# Patient Record
Sex: Male | Born: 1990 | Race: Black or African American | Hispanic: No | Marital: Single | Smoking: Current some day smoker
Health system: Southern US, Community
[De-identification: ages and names within clinical notes are randomized; demographics above are authoritative.]

## PROBLEM LIST (undated history)

## (undated) DIAGNOSIS — L309 Dermatitis, unspecified: Secondary | ICD-10-CM

## (undated) HISTORY — DX: Dermatitis, unspecified: L30.9

---
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
## 2015-10-17 ENCOUNTER — Encounter: Payer: Self-pay | Admitting: Emergency Medicine

## 2015-10-17 DIAGNOSIS — R079 Chest pain, unspecified: Secondary | ICD-10-CM | POA: Insufficient documentation

## 2015-10-17 DIAGNOSIS — R42 Dizziness and giddiness: Secondary | ICD-10-CM | POA: Diagnosis not present

## 2015-10-17 DIAGNOSIS — R109 Unspecified abdominal pain: Secondary | ICD-10-CM | POA: Diagnosis present

## 2015-10-17 DIAGNOSIS — R0602 Shortness of breath: Secondary | ICD-10-CM | POA: Insufficient documentation

## 2015-10-17 DIAGNOSIS — F17200 Nicotine dependence, unspecified, uncomplicated: Secondary | ICD-10-CM | POA: Diagnosis not present

## 2015-10-17 LAB — COMPREHENSIVE METABOLIC PANEL
ALT: 72 U/L — AB (ref 17–63)
ANION GAP: 6 (ref 5–15)
AST: 45 U/L — ABNORMAL HIGH (ref 15–41)
Albumin: 4.1 g/dL (ref 3.5–5.0)
Alkaline Phosphatase: 68 U/L (ref 38–126)
BUN: 13 mg/dL (ref 6–20)
CHLORIDE: 110 mmol/L (ref 101–111)
CO2: 24 mmol/L (ref 22–32)
CREATININE: 0.96 mg/dL (ref 0.61–1.24)
Calcium: 9.1 mg/dL (ref 8.9–10.3)
GFR calc non Af Amer: >60 mL/min (ref >60)
Glucose, Bld: 112 mg/dL — ABNORMAL HIGH (ref 65–99)
POTASSIUM: 3.6 mmol/L (ref 3.5–5.1)
SODIUM: 140 mmol/L (ref 135–145)
Total Bilirubin: 0.3 mg/dL (ref 0.3–1.2)
Total Protein: 7.1 g/dL (ref 6.5–8.1)

## 2015-10-17 LAB — CBC
HEMATOCRIT: 42.7 % (ref 40.0–52.0)
HEMOGLOBIN: 14.2 g/dL (ref 13.0–18.0)
MCH: 27.5 pg (ref 26.0–34.0)
MCHC: 33.2 g/dL (ref 32.0–36.0)
MCV: 83 fL (ref 80.0–100.0)
Platelets: 237 10*3/uL (ref 150–440)
RBC: 5.15 MIL/uL (ref 4.40–5.90)
RDW: 14.1 % (ref 11.5–14.5)
WBC: 5.7 10*3/uL (ref 3.8–10.6)

## 2015-10-17 LAB — TROPONIN I: Troponin I: <0.03 ng/mL (ref <0.031)

## 2015-10-17 LAB — LIPASE, BLOOD: LIPASE: 32 U/L (ref 11–51)

## 2015-10-17 NOTE — ED Notes (Signed)
Pt states onset of emesis x1 less than one hour pta with associated epigastric pain. Pt denies nausea currently, denies fever, chills, diarrhea. Pt appears in no acute distress. Last po intake taco salad at 2030.

## 2015-10-18 ENCOUNTER — Emergency Department: Payer: BLUE CROSS/BLUE SHIELD

## 2015-10-18 ENCOUNTER — Emergency Department
Admission: EM | Admit: 2015-10-18 | Discharge: 2015-10-18 | Disposition: A | Discharge status: Home / Self Care | Payer: BLUE CROSS/BLUE SHIELD | Attending: Emergency Medicine | Admitting: Emergency Medicine

## 2015-10-18 DIAGNOSIS — R079 Chest pain, unspecified: Secondary | ICD-10-CM

## 2015-10-18 LAB — TROPONIN I: Troponin I: <0.03 ng/mL (ref <0.031)

## 2015-10-18 NOTE — ED Notes (Signed)
Repeat troponin sent

## 2015-10-18 NOTE — ED Provider Notes (Signed)
[facility] Emergency Department Provider Note  ____________________________________________  Time seen: Approximately 405 AM  I have reviewed the triage vital signs and the nursing notes.   HISTORY  Chief Complaint Emesis and Abdominal Pain    HPI Nathan Compton is a 25 y.o. male who comes into the hospital today with some chest pain. The patient reports that around 9:30 PM he had an episode of vomiting. He reports that while he was vomiting he developed some chest pain and shortness of breath. He reports that the symptoms are gone now but it lasted about an hour and a half. The patient denies any diarrhea and had some slight abdominal pain. The patient endorses some dizziness and nausea but did not vomit again. The patient denies sick contacts and denies any strange nowhere foods. The patient reports that he had a similar symptom last year but had an EKG and everything was fine. The pain was a sharp stabbing pain into his heart is what the patient reports. The patient reports that when he had the pain a year ago he had taken Mucinex before hand but he did not take anything like that tonight. The patient did not take any medicines for his pain. He called his mother and his mother recommended him coming into the hospital immediately.   History reviewed. No pertinent past medical history.  There are no active problems to display for this patient.   History reviewed. No pertinent past surgical history.  No current outpatient prescriptions on file.  Allergies Review of patient's allergies indicates no known allergies.  History reviewed. No pertinent family history.  Social History Social History  Substance Use Topics  . Smoking status: Current Every Day Smoker  . Smokeless tobacco: Never Used  . Alcohol Use: Yes    Review of Systems Constitutional: No fever/chills Eyes: No visual changes. ENT: No sore throat. Cardiovascular:  chest pain. Respiratory:   shortness of breath. Gastrointestinal:  abdominal pain. no vomiting.  No diarrhea.  No constipation. Genitourinary: Negative for dysuria. Musculoskeletal: Negative for back pain. Skin: Negative for rash. Neurological: Dizziness  10-point ROS otherwise negative.  ____________________________________________   PHYSICAL EXAM:  VITAL SIGNS: ED Triage Vitals  Enc Vitals Group     BP 10/17/15 2238 145/87 mmHg     Pulse Rate 10/17/15 2238 69     Resp 10/17/15 2238 16     Temp 10/17/15 2238 98.4 F (36.9 C)     Temp Source 10/17/15 2238 Oral     SpO2 10/17/15 2238 100 %     Weight 10/17/15 2238 285 lb (129.275 kg)     Height 10/17/15 2238  (1.753 m)     Head Cir --      Peak Flow --      Pain Score 10/17/15 2238 3     Pain Loc --      Pain Edu? --      Excl. in GC? --     Constitutional: Alert and oriented. Well appearing and in no acute distress. Eyes: Conjunctivae are normal. PERRL. EOMI. Head: Atraumatic. Nose: No congestion/rhinnorhea. Mouth/Throat: Mucous membranes are moist.  Oropharynx non-erythematous. Cardiovascular: Normal rate, regular rhythm. Grossly normal heart sounds.  Good peripheral circulation. Respiratory: Normal respiratory effort.  No retractions. Lungs CTAB. Gastrointestinal: Soft and nontender. No distention. Positive bowel sounds Musculoskeletal: No lower extremity tenderness nor edema.   Neurologic:  Normal speech and language.  Skin:  Skin is warm, dry and intact.  Psychiatric: Mood and  affect are normal.   ____________________________________________   LABS (all labs ordered are listed, but only abnormal results are displayed)  Labs Reviewed  COMPREHENSIVE METABOLIC PANEL - Abnormal; Notable for the following:    Glucose, Bld 112 (*)    AST 45 (*)    ALT 72 (*)    All other components within normal limits  LIPASE, BLOOD  CBC  TROPONIN I  TROPONIN I  URINALYSIS COMPLETEWITH MICROSCOPIC ([facility] ONLY)    ____________________________________________  EKG  ED ECG REPORT I, Rebecka ApleyWebster,  Shenetta Schnackenberg P, the attending physician, personally viewed and interpreted this ECG.   Date: 10/17/2015  EKG Time: 2258  Rate: 57  Rhythm: sinus bradycardia  Axis: none  Intervals:none  ST&T Change: Flipped T waves in leads 1, 2, 3, aVF, aVL, V4, V5, V6  ____________________________________________  RADIOLOGY  Chest x-ray: No acute cardiopulmonary process seen ____________________________________________   PROCEDURES  Procedure(s) performed: None  Critical Care performed: No  ____________________________________________   INITIAL IMPRESSION / ASSESSMENT AND PLAN / ED COURSE  Pertinent labs & imaging results that were available during my care of the patient were reviewed by me and considered in my medical decision making (see chart for details).  This is a 25 year old male who comes into the hospital today with chest pain. The patient reports he was vomiting when the pain started. The patient's initial blood work is unremarkable and his chest x-ray is negative. I'll repeat the patient's troponin and at that time disposition the patient.  The patient remains pain free and his repeat troponin is negative. He will be discharged to home to follow up with cardiology.  ____________________________________________   FINAL CLINICAL IMPRESSION(S) / ED DIAGNOSES  Final diagnoses:  Chest pain, unspecified chest pain type      Rebecka ApleyAllison P Lyana Asbill, MD 10/18/15 [phone]

## 2015-10-18 NOTE — Discharge Instructions (Signed)
Nonspecific Chest Pain  °Chest pain can be caused by many different conditions. There is always a chance that your pain could be related to something serious, such as a heart attack or a blood clot in your lungs. Chest pain can also be caused by conditions that are not life-threatening. If you have chest pain, it is very important to follow up with your health care provider. °CAUSES  °Chest pain can be caused by: °· Heartburn. °· Pneumonia or bronchitis. °· Anxiety or stress. °· Inflammation around your heart (pericarditis) or lung (pleuritis or pleurisy). °· A blood clot in your lung. °· A collapsed lung (pneumothorax). It can develop suddenly on its own (spontaneous pneumothorax) or from trauma to the chest. °· Shingles infection (varicella-zoster virus). °· Heart attack. °· Damage to the bones, muscles, and cartilage that make up your chest wall. This can include: °¨ Bruised bones due to injury. °¨ Strained muscles or cartilage due to frequent or repeated coughing or overwork. °¨ Fracture to one or more ribs. °¨ Sore cartilage due to inflammation (costochondritis). °RISK FACTORS  °Risk factors for chest pain may include: °· Activities that increase your risk for trauma or injury to your chest. °· Respiratory infections or conditions that cause frequent coughing. °· Medical conditions or overeating that can cause heartburn. °· Heart disease or family history of heart disease. °· Conditions or health behaviors that increase your risk of developing a blood clot. °· Having had chicken pox (varicella zoster). °SIGNS AND SYMPTOMS °Chest pain can feel like: °· Burning or tingling on the surface of your chest or deep in your chest. °· Crushing, pressure, aching, or squeezing pain. °· Dull or sharp pain that is worse when you move, cough, or take a deep breath. °· Pain that is also felt in your back, neck, shoulder, or arm, or pain that spreads to any of these areas. °Your chest pain may come and go, or it may stay  constant. °DIAGNOSIS °Lab tests or other studies may be needed to find the cause of your pain. Your health care provider may have you take a test called an ambulatory ECG (electrocardiogram). An ECG records your heartbeat patterns at the time the test is performed. You may also have other tests, such as: °· Transthoracic echocardiogram (TTE). During echocardiography, sound waves are used to create a picture of all of the heart structures and to look at how blood flows through your heart. °· Transesophageal echocardiogram (TEE). This is a more advanced imaging test that obtains images from inside your body. It allows your health care provider to see your heart in finer detail. °· Cardiac monitoring. This allows your health care provider to monitor your heart rate and rhythm in real time. °· Holter monitor. This is a portable device that records your heartbeat and can help to diagnose abnormal heartbeats. It allows your health care provider to track your heart activity for several days, if needed. °· Stress tests. These can be done through exercise or by taking medicine that makes your heart beat more quickly. °· Blood tests. °· Imaging tests. °TREATMENT  °Your treatment depends on what is causing your chest pain. Treatment may include: °· Medicines. These may include: °¨ Acid blockers for heartburn. °¨ Anti-inflammatory medicine. °¨ Pain medicine for inflammatory conditions. °¨ Antibiotic medicine, if an infection is present. °¨ Medicines to dissolve blood clots. °¨ Medicines to treat coronary artery disease. °· Supportive care for conditions that do not require medicines. This may include: °¨ Resting. °¨ Applying heat   or cold packs to injured areas. °¨ Limiting activities until pain decreases. °HOME CARE INSTRUCTIONS °· If you were prescribed an antibiotic medicine, finish it all even if you start to feel better. °· Avoid any activities that bring on chest pain. °· Do not use any tobacco products, including  cigarettes, chewing tobacco, or electronic cigarettes. If you need help quitting, ask your health care provider. °· Do not drink alcohol. °· Take medicines only as directed by your health care provider. °· Keep all follow-up visits as directed by your health care provider. This is important. This includes any further testing if your chest pain does not go away. °· If heartburn is the cause for your chest pain, you may be told to keep your head raised (elevated) while sleeping. This reduces the chance that acid will go from your stomach into your esophagus. °· Make lifestyle changes as directed by your health care provider. These may include: °¨ Getting regular exercise. Ask your health care provider to suggest some activities that are safe for you. °¨ Eating a heart-healthy diet. A registered dietitian can help you to learn healthy eating options. °¨ Maintaining a healthy weight. °¨ Managing diabetes, if necessary. °¨ Reducing stress. °SEEK MEDICAL CARE IF: °· Your chest pain does not go away after treatment. °· You have a rash with blisters on your chest. °· You have a fever. °SEEK IMMEDIATE MEDICAL CARE IF:  °· Your chest pain is worse. °· You have an increasing cough, or you cough up blood. °· You have severe abdominal pain. °· You have severe weakness. °· You faint. °· You have chills. °· You have sudden, unexplained chest discomfort. °· You have sudden, unexplained discomfort in your arms, back, neck, or jaw. °· You have shortness of breath at any time. °· You suddenly start to sweat, or your skin gets clammy. °· You feel nauseous or you vomit. °· You suddenly feel light-headed or dizzy. °· Your heart begins to beat quickly, or it feels like it is skipping beats. °These symptoms may represent a serious problem that is an emergency. Do not wait to see if the symptoms will go away. Get medical help right away. Call your local emergency services (911 in the [place] Do not drive yourself to the hospital. °  °This  information is not intended to replace advice given to you by your health care provider. Make sure you discuss any questions you have with your health care provider. °  °Document Released: 04/19/2005 Document Revised: 07/31/2014 Document Reviewed: 02/13/2014 °Elsevier Interactive Patient Education ©2016 [employer] ° °

## 2015-12-18 ENCOUNTER — Emergency Department
Admission: EM | Admit: 2015-12-18 | Discharge: 2015-12-18 | Disposition: A | Discharge status: Home / Self Care | Payer: BLUE CROSS/BLUE SHIELD | Attending: Emergency Medicine | Admitting: Emergency Medicine

## 2015-12-18 ENCOUNTER — Encounter: Payer: Self-pay | Admitting: Emergency Medicine

## 2015-12-18 DIAGNOSIS — H9201 Otalgia, right ear: Secondary | ICD-10-CM | POA: Diagnosis present

## 2015-12-18 DIAGNOSIS — H66001 Acute suppurative otitis media without spontaneous rupture of ear drum, right ear: Secondary | ICD-10-CM | POA: Diagnosis not present

## 2015-12-18 DIAGNOSIS — Z87891 Personal history of nicotine dependence: Secondary | ICD-10-CM | POA: Insufficient documentation

## 2015-12-18 MED ORDER — AMOXICILLIN 875 MG PO TABS: 875.0000 mg | ORAL_TABLET | Freq: Two times a day (BID) | ORAL | Status: AC

## 2015-12-18 MED ORDER — AMOXICILLIN 500 MG PO CAPS
1000.0000 mg | ORAL_CAPSULE | Freq: Three times a day (TID) | ORAL | Status: DC
  Administered 2015-12-18: 1000 mg via ORAL
  Filled 2015-12-18: qty 2

## 2015-12-18 NOTE — Discharge Instructions (Signed)

## 2015-12-18 NOTE — ED Notes (Signed)
Pt presents to ED with c/o right ear pain. Sudden onset approx 4 hours ago. Pt reports he was feeling fine prior to onset of his symptoms. Pt alert and calm at this time with no distress noted. Denies fever.

## 2015-12-18 NOTE — ED Provider Notes (Signed)
[facility] Emergency Department Provider Note   ____________________________________________  Time seen: Approximately 6:26 AM  I have reviewed the triage vital signs and the nursing notes.   HISTORY  Chief Complaint Otalgia    HPI Nathan Compton is a 25 y.o. male for causes of the hospital today with a concern that he has an ear infection. The patient reports that it came on suddenly. He was fine all day until about 10 PM yesterday when he developed some severe pain in his right ear. He reports that it is been a long time since he had an ear infection. The patient denies any fevers denies any cough cold or runny nose. The patient did have a URI last week and reports that his nose is still mildly stuffy. The patient reports that his pain is a 3 out of 10 in intensity currently. The patient did not take any medication for his pain. He is here for evaluation.   History reviewed. No pertinent past medical history.  There are no active problems to display for this patient.   History reviewed. No pertinent past surgical history.  Current Outpatient Rx  Name  Route  Sig  Dispense  Refill  . amoxicillin (AMOXIL) 875 MG tablet   Oral   Take 1 tablet (875 mg total) by mouth 2 (two) times daily.   14 tablet   0     Allergies Review of patient's allergies indicates no known allergies.  No family history on file.  Social History Social History  Substance Use Topics  . Smoking status: Former Games developer  . Smokeless tobacco: Never Used  . Alcohol Use: Yes    Review of Systems Constitutional: No fever/chills Eyes: No visual changes. ENT: Right ear pain. Cardiovascular: Denies chest pain. Respiratory: Denies shortness of breath. Gastrointestinal: No abdominal pain.  No nausea, no vomiting.  No diarrhea.  No constipation. Genitourinary: Negative for dysuria. Musculoskeletal: Negative for back pain. Skin: Negative for rash. Neurological: Negative for  headaches, focal weakness or numbness.  10-point ROS otherwise negative.  ____________________________________________   PHYSICAL EXAM:  VITAL SIGNS: ED Triage Vitals  Enc Vitals Group     BP 12/18/15 0207 148/85 mmHg     Pulse Rate 12/18/15 0207 64     Resp 12/18/15 0207 18     Temp 12/18/15 0207 98.2 F (36.8 C)     Temp Source 12/18/15 0207 Oral     SpO2 12/18/15 0207 98 %     Weight 12/18/15 0207 288 lb (130.636 kg)     Height 12/18/15 0207  (1.778 m)     Head Cir --      Peak Flow --      Pain Score 12/18/15 0208 8     Pain Loc --      Pain Edu? --      Excl. in GC? --     Constitutional: Alert and oriented. Well appearing and in Mild distress. Ears: Left TM gray flat and dull, right TM with some erythema and bulging. Eyes: Conjunctivae are normal. PERRL. EOMI. Head: Atraumatic. Nose: No congestion/rhinnorhea. Mouth/Throat: Mucous membranes are moist.  Oropharynx non-erythematous. Cardiovascular: Normal rate, regular rhythm. Grossly normal heart sounds.  Good peripheral circulation. Respiratory: Normal respiratory effort.  No retractions. Lungs CTAB. Gastrointestinal: Soft and nontender. No distention. Positive bowel sounds Musculoskeletal: No lower extremity tenderness nor edema.   Neurologic:  Normal speech and language.  Skin:  Skin is warm, dry and intact. Marland Kitchen Psychiatric: Mood  and affect are normal.   ____________________________________________   LABS (all labs ordered are listed, but only abnormal results are displayed)  Labs Reviewed - No data to display ____________________________________________  EKG  None ____________________________________________  RADIOLOGY  None ____________________________________________   PROCEDURES  Procedure(s) performed: None  Critical Care performed: No  ____________________________________________   INITIAL IMPRESSION / ASSESSMENT AND PLAN / ED COURSE  Pertinent labs & imaging results that were  available during my care of the patient were reviewed by me and considered in my medical decision making (see chart for details).  This is a 25 year old male who comes in with some right ear pain. The patient is concerned that he has an ear infection. The patient's ear does appear red. He reports though that the pain is significantly improved. I will give the patient a prescription for some amoxicillin and I'll give him a dose of amoxicillin here in the emergency department. The patient will be discharged home to follow-up with the acute care clinic or his primary care physician. ____________________________________________   FINAL CLINICAL IMPRESSION(S) / ED DIAGNOSES  Final diagnoses:  Acute suppurative otitis media of right ear without spontaneous rupture of tympanic membrane, recurrence not specified      NEW MEDICATIONS STARTED DURING THIS VISIT:  Discharge Medication List as of 12/18/2015  6:41 AM    START taking these medications   Details  amoxicillin (AMOXIL) 875 MG tablet Take 1 tablet (875 mg total) by mouth 2 (two) times daily., Starting 12/18/2015, Until Wed 12/29/15, Print         Note:  This document was prepared using Dragon voice recognition software and may include unintentional dictation errors.    Rebecka ApleyAllison P Josette Shimabukuro, MD 12/18/15 [phone]

## 2018-07-21 ENCOUNTER — Encounter: Payer: Self-pay | Admitting: Emergency Medicine

## 2018-07-21 ENCOUNTER — Other Ambulatory Visit: Payer: Self-pay

## 2018-07-21 ENCOUNTER — Emergency Department
Admission: EM | Admit: 2018-07-21 | Discharge: 2018-07-21 | Disposition: A | Discharge status: Home / Self Care | Payer: Medicaid Other | Attending: Emergency Medicine | Admitting: Emergency Medicine

## 2018-07-21 DIAGNOSIS — J020 Streptococcal pharyngitis: Secondary | ICD-10-CM | POA: Diagnosis not present

## 2018-07-21 DIAGNOSIS — Z87891 Personal history of nicotine dependence: Secondary | ICD-10-CM | POA: Diagnosis not present

## 2018-07-21 DIAGNOSIS — J029 Acute pharyngitis, unspecified: Secondary | ICD-10-CM | POA: Diagnosis present

## 2018-07-21 DIAGNOSIS — R509 Fever, unspecified: Secondary | ICD-10-CM

## 2018-07-21 LAB — INFLUENZA PANEL BY PCR (TYPE A & B)
Influenza A By PCR: NEGATIVE
Influenza B By PCR: NEGATIVE

## 2018-07-21 LAB — GROUP A STREP BY PCR: GROUP A STREP BY PCR: DETECTED — AB

## 2018-07-21 MED ORDER — AMOXICILLIN 875 MG PO TABS: 875.0000 mg | ORAL_TABLET | Freq: Two times a day (BID) | ORAL | 0 refills | Status: DC

## 2018-07-21 MED ORDER — IBUPROFEN 800 MG PO TABS
800.0000 mg | ORAL_TABLET | Freq: Once | ORAL | Status: AC
  Administered 2018-07-21: 800 mg via ORAL
  Filled 2018-07-21: qty 1

## 2018-07-21 MED ORDER — AMOXICILLIN 500 MG PO CAPS
1000.0000 mg | ORAL_CAPSULE | Freq: Once | ORAL | Status: AC
Start: 2018-07-21 — End: 2018-07-21
  Administered 2018-07-21: 1000 mg via ORAL
  Filled 2018-07-21: qty 2

## 2018-07-21 NOTE — ED Provider Notes (Signed)
[facility] EMERGENCY DEPARTMENT Provider Note   CSN: [id number] Arrival date & time: 07/21/18  1855     History   Chief Complaint Chief Complaint  Patient presents with  . Fever  . Sore Throat    HPI Nathan Compton is a 27 y.o. male presents today for evaluation of sore throat pain.  He said sore throat fevers and body aches for 2 days.  Tolerating p.o. well.  Denies any cough congestion runny nose.  He denies any headaches, rashes, neck pain. HPI  History reviewed. No pertinent past medical history.  There are no active problems to display for this patient.   History reviewed. No pertinent surgical history.      Home Medications    Prior to Admission medications   Medication Sig Start Date End Date Taking? Authorizing Provider  amoxicillin (AMOXIL) 875 MG tablet Take 1 tablet (875 mg total) by mouth 2 (two) times daily. X 10 days 07/21/18   Ronnette JuniperGaines,  C, PA-C    Family History History reviewed. No pertinent family history.  Social History Social History   Tobacco Use  . Smoking status: Former Games developermoker  . Smokeless tobacco: Never Used  Substance Use Topics  . Alcohol use: Yes  . Drug use: No     Allergies   Patient has no known allergies.   Review of Systems Review of Systems  Constitutional: Positive for chills and fever.  HENT: Positive for sore throat. Negative for congestion, rhinorrhea, sinus pressure and sinus pain.   Respiratory: Negative for cough, wheezing and stridor.   Gastrointestinal: Negative for diarrhea, nausea and vomiting.  Musculoskeletal: Positive for myalgias. Negative for arthralgias and neck stiffness.  Skin: Negative for rash.  Neurological: Negative for dizziness.     Physical Exam Updated Vital Signs BP (!) 142/82 (BP Location: Left Arm)   Pulse (!) 107   Temp (!) 100.4 F (38 C) (Oral)   Resp 18   Ht 5\' 10"  (1.778 m)   Wt 117.9 kg   SpO2 99%   BMI 37.31 kg/m   Physical  Exam Constitutional:      General: He is not in acute distress.    Appearance: He is well-developed.  HENT:     Head: Normocephalic and atraumatic.     Jaw: No trismus.     Right Ear: Hearing, tympanic membrane, ear canal and external ear normal.     Left Ear: Hearing, tympanic membrane, ear canal and external ear normal.     Nose: No rhinorrhea.     Right Sinus: No maxillary sinus tenderness or frontal sinus tenderness.     Left Sinus: No maxillary sinus tenderness or frontal sinus tenderness.     Mouth/Throat:     Pharynx: Uvula midline. Oropharyngeal exudate and posterior oropharyngeal erythema present. No uvula swelling.     Tonsils: Tonsillar exudate present. No tonsillar abscesses.  Eyes:     Conjunctiva/sclera: Conjunctivae normal.  Neck:     Musculoskeletal: Normal range of motion.  Cardiovascular:     Rate and Rhythm: Normal rate and regular rhythm.  Pulmonary:     Effort: No respiratory distress.     Breath sounds: No stridor. No wheezing.  Chest:     Chest wall: No tenderness.  Abdominal:     General: There is no distension.     Palpations: Abdomen is soft.     Tenderness: There is no abdominal tenderness. There is no guarding.  Musculoskeletal: Normal range of motion.  Skin:  General: Skin is warm and dry.     Findings: No rash.  Neurological:     Mental Status: He is alert and oriented to person, place, and time.  Psychiatric:        Behavior: Behavior normal.        Thought Content: Thought content normal.        Judgment: Judgment normal.      ED Treatments / Results  Labs (all labs ordered are listed, but only abnormal results are displayed) Labs Reviewed  GROUP A STREP BY PCR - Abnormal; Notable for the following components:      Result Value   Group A Strep by PCR DETECTED (*)    All other components within normal limits  INFLUENZA PANEL BY PCR (TYPE A & B)    EKG None  Radiology No results found.  Procedures Procedures (including  critical care time)  Medications Ordered in ED Medications  amoxicillin (AMOXIL) capsule 1,000 mg (has no administration in time range)  ibuprofen (ADVIL,MOTRIN) tablet 800 mg (800 mg Oral Given 07/21/18 1916)     Initial Impression / Assessment and Plan / ED Course  I have reviewed the triage vital signs and the nursing notes.  Pertinent labs & imaging results that were available during my care of the patient were reviewed by me and considered in my medical decision making (see chart for details).     27 year old male with exudative tonsillitis.  PCR strep test positive.  Started on amoxicillin.  Tolerating p.o. well.  Understands signs symptoms return to ED for.  Final Clinical Impressions(s) / ED Diagnoses   Final diagnoses:  Strep pharyngitis  Fever in pediatric patient    ED Discharge Orders         Ordered    amoxicillin (AMOXIL) 875 MG tablet  2 times daily     07/21/18 2032           Ronnette JuniperGaines,  C, PA-C 07/21/18 2038    Phineas SemenGoodman, Graydon, MD 07/21/18 [phone]

## 2018-07-21 NOTE — Discharge Instructions (Signed)
Please alternate Tylenol and ibuprofen as needed for pain and fevers.  Take antibiotics as prescribed.  If any increasing pain, difficulty swallowing return to the emergency department.

## 2018-07-21 NOTE — ED Triage Notes (Signed)
Pt reports tingling in his throat that started yesterday; temp in triage 100.4; 2 family members diagnosed yesterday with strep; 2 family members diagnosed with flu 3 days ago; pt says painful to swallow;

## 2021-07-22 ENCOUNTER — Emergency Department: Payer: Medicaid Other

## 2021-07-22 ENCOUNTER — Emergency Department
Admission: EM | Admit: 2021-07-22 | Discharge: 2021-07-22 | Disposition: A | Discharge status: Home / Self Care | Payer: Medicaid Other | Attending: Emergency Medicine | Admitting: Emergency Medicine

## 2021-07-22 ENCOUNTER — Encounter: Payer: Self-pay | Admitting: Emergency Medicine

## 2021-07-22 ENCOUNTER — Other Ambulatory Visit: Payer: Self-pay

## 2021-07-22 DIAGNOSIS — W231XXA Caught, crushed, jammed, or pinched between stationary objects, initial encounter: Secondary | ICD-10-CM | POA: Insufficient documentation

## 2021-07-22 DIAGNOSIS — S96811A Strain of other specified muscles and tendons at ankle and foot level, right foot, initial encounter: Secondary | ICD-10-CM | POA: Diagnosis not present

## 2021-07-22 DIAGNOSIS — S86011A Strain of right Achilles tendon, initial encounter: Secondary | ICD-10-CM

## 2021-07-22 DIAGNOSIS — Z87891 Personal history of nicotine dependence: Secondary | ICD-10-CM | POA: Diagnosis not present

## 2021-07-22 DIAGNOSIS — S99911A Unspecified injury of right ankle, initial encounter: Secondary | ICD-10-CM | POA: Diagnosis present

## 2021-07-22 DIAGNOSIS — Y990 Civilian activity done for income or pay: Secondary | ICD-10-CM | POA: Diagnosis not present

## 2021-07-22 MED ORDER — MELOXICAM 15 MG PO TABS: 15.0000 mg | ORAL_TABLET | Freq: Every day | ORAL | 0 refills | Status: AC

## 2021-07-22 MED ORDER — MELOXICAM 7.5 MG PO TABS
15.0000 mg | ORAL_TABLET | Freq: Once | ORAL | Status: AC
  Administered 2021-07-22: 22:00:00 15 mg via ORAL
  Filled 2021-07-22: qty 2

## 2021-07-22 NOTE — ED Provider Notes (Signed)
[facility] Emergency Department Provider Note  ____________________________________________  Time seen: Approximately 10:03 PM  I have reviewed the triage vital signs and the nursing notes.   HISTORY  Chief Complaint Ankle Pain    HPI Nathan Compton is a 30 y.o. male who presents the emergency department complaining of right ankle pain.  Patient states that 2 days ago he was helping move a pool table when it dropped and fell on his ankle.  Patient states that it hurt at the time, he was able to walk without a limp, return work today.  Patient states that today while at work his feet became entrapped onto pallets as he was trying to stop creating a pulling type injury to the back of the ankle.  Patient's pain is primarily along his Achilles tendon.  He is able to bear weight at this time.  He is able to move his ankle joint.  No open wounds.  No history of previous ankle injuries.       History reviewed. No pertinent past medical history.  There are no problems to display for this patient.   History reviewed. No pertinent surgical history.  Prior to Admission medications   Medication Sig Start Date End Date Taking? Authorizing Provider  meloxicam (MOBIC) 15 MG tablet Take 1 tablet (15 mg total) by mouth daily. 07/22/21 07/22/22 Yes Aziza Stuckert, Delorise Royals, PA-C  amoxicillin (AMOXIL) 875 MG tablet Take 1 tablet (875 mg total) by mouth 2 (two) times daily. X 10 days 07/21/18   Evon Slack, PA-C    Allergies Patient has no known allergies.  No family history on file.  Social History Social History   Tobacco Use   Smoking status: Former   Smokeless tobacco: Never  Substance Use Topics   Alcohol use: Yes   Drug use: No     Review of Systems  Constitutional: No fever/chills Eyes: No visual changes. No discharge ENT: No upper respiratory complaints. Cardiovascular: no chest pain. Respiratory: no cough. No SOB. Gastrointestinal: No abdominal  pain.  No nausea, no vomiting.  No diarrhea.  No constipation. Musculoskeletal: Right ankle injury/pain Skin: Negative for rash, abrasions, lacerations, ecchymosis. Neurological: Negative for headaches, focal weakness or numbness.  10 System ROS otherwise negative.  ____________________________________________   PHYSICAL EXAM:  VITAL SIGNS: ED Triage Vitals  Enc Vitals Group     BP 07/22/21 2001 (!) 146/105     Pulse Rate 07/22/21 2001 83     Resp 07/22/21 2001 18     Temp 07/22/21 2001 98.3 F (36.8 C)     Temp Source 07/22/21 2001 Oral     SpO2 07/22/21 2001 100 %     Weight 07/22/21 2015 235 lb (106.6 kg)     Height 07/22/21 2015 5\' 10"  (1.778 m)     Head Circumference --      Peak Flow --      Pain Score 07/22/21 2015 7     Pain Loc --      Pain Edu? --      Excl. in GC? --      Constitutional: Alert and oriented. Well appearing and in no acute distress. Eyes: Conjunctivae are normal. PERRL. EOMI. Head: Atraumatic. ENT:      Ears:       Nose: No congestion/rhinnorhea.      Mouth/Throat: Mucous membranes are moist.  Neck: No stridor.    Cardiovascular: Normal rate, regular rhythm. Normal S1 and S2.  Good peripheral circulation. Respiratory:  Normal respiratory effort without tachypnea or retractions. Lungs CTAB. Good air entry to the bases with no decreased or absent breath sounds. Musculoskeletal: Full range of motion to all extremities. No gross deformities appreciated.  Visualization of the right ankle reveals no deformity.  No open wounds.  Patient is tender along the Achilles tendon with no deficits.  No tenderness along the anterior joint line. Neurologic:  Normal speech and language. No gross focal neurologic deficits are appreciated.  Skin:  Skin is warm, dry and intact. No rash noted. Psychiatric: Mood and affect are normal. Speech and behavior are normal. Patient exhibits appropriate insight and judgement.   ____________________________________________    LABS (all labs ordered are listed, but only abnormal results are displayed)  Labs Reviewed - No data to display ____________________________________________  EKG   ____________________________________________  RADIOLOGY I personally viewed and evaluated these images as part of my medical decision making, as well as reviewing the written report by the radiologist.  ED Provider Interpretation: Slight widening of the ankle more T's joint.  There is no tenderness along the anterior joint line.  DG Ankle Complete Right  Result Date: 07/22/2021 CLINICAL DATA:  Trauma to right ankle, crush injury, pain and swelling EXAM: RIGHT ANKLE - COMPLETE 3+ VIEW COMPARISON:  None. FINDINGS: Frontal, oblique, lateral views of the right ankle are obtained. No acute fracture. Slight widening of the ankle mortise along the medial aspect, which could reflect underlying ligamentous laxity or injury. No dislocation. Joint spaces are well preserved. Soft tissues are unremarkable. IMPRESSION: 1. No acute bony abnormality. 2. Mild widening of the ankle mortise along the medial aspect. This could be due to ligamentous laxity or injury. Electronically Signed   By: Sharlet Salina M.D.   On: 07/22/2021 20:46    ____________________________________________    PROCEDURES  Procedure(s) performed:    Procedures    Medications  meloxicam (MOBIC) tablet 15 mg (has no administration in time range)     ____________________________________________   INITIAL IMPRESSION / ASSESSMENT AND PLAN / ED COURSE  Pertinent labs & imaging results that were available during my care of the patient were reviewed by me and considered in my medical decision making (see chart for details).  Review of the [place] CSRS was performed in accordance of the [facility] prior to dispensing any controlled drugs.           Patient's diagnosis is consistent with Achilles tendon strain.  Patient presents emergency department complaining of  posterior right ankle pain.  He is tender along the Achilles tendon with no evidence of rupture.  X-ray revealed widening of the ankle mortise joint however patient has no tenderness along this distribution and mechanism of injury does not appear to be consistent with a ligamentous injury along this region.  Patient will be placed in ASO lace up stirrup ankle brace, given anti-inflammatories.  If symptoms or not improving follow-up with orthopedics..  Patient is given ED precautions to return to the ED for any worsening or new symptoms.     ____________________________________________  FINAL CLINICAL IMPRESSION(S) / ED DIAGNOSES  Final diagnoses:  Strain of right Achilles tendon, initial encounter      NEW MEDICATIONS STARTED DURING THIS VISIT:  ED Discharge Orders          Ordered    meloxicam (MOBIC) 15 MG tablet  Daily        07/22/21 2216                This chart was  dictated using voice recognition software/Dragon. Despite best efforts to proofread, errors can occur which can change the meaning. Any change was purely unintentional.    Racheal Patches, PA-C 07/22/21 2221    Merwyn Katos, MD 07/23/21 [phone]

## 2021-07-22 NOTE — ED Notes (Signed)
Pt discharge information reviewed. Pt understands need for follow up care and when to return if symptoms worsen. All questions answered. Pt is alert and oriented with even and regular respirations. Pt is seen ambulating out of department with string steady gait.   

## 2021-07-22 NOTE — ED Notes (Signed)
Ankle Brace applied to right ankle

## 2021-07-22 NOTE — ED Triage Notes (Signed)
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  Pt reports he was at work today and ankle caught between 2 pallets. Reports pain and Swelling/ Pt talks in complete sentences no respiratory distress noted

## 2022-05-11 ENCOUNTER — Encounter: Payer: Self-pay | Admitting: Physician Assistant

## 2022-05-11 ENCOUNTER — Ambulatory Visit: Payer: Medicaid Other | Admitting: Physician Assistant

## 2022-05-11 DIAGNOSIS — Z113 Encounter for screening for infections with a predominantly sexual mode of transmission: Secondary | ICD-10-CM | POA: Diagnosis not present

## 2022-05-11 DIAGNOSIS — N341 Nonspecific urethritis: Secondary | ICD-10-CM

## 2022-05-11 LAB — GRAM STAIN

## 2022-05-11 LAB — HEPATITIS B SURFACE ANTIGEN: Hepatitis B Surface Ag: NONREACTIVE

## 2022-05-11 LAB — HM HEPATITIS C SCREENING LAB: HM Hepatitis Screen: NEGATIVE

## 2022-05-11 LAB — HM HIV SCREENING LAB: HM HIV Screening: NEGATIVE

## 2022-05-11 MED ORDER — DOXYCYCLINE HYCLATE 100 MG PO TABS: 100.0000 mg | ORAL_TABLET | Freq: Two times a day (BID) | ORAL | 0 refills | Status: AC

## 2022-05-11 NOTE — Progress Notes (Signed)
Gram stain reviewed - treated for NGU per standing order. Cephus Richer PA-C aware. Rich Number, RN

## 2022-05-11 NOTE — Progress Notes (Signed)
[facility] Department STI clinic/screening visit  Subjective:  Nathan Compton is a 31 y.o. male being seen today for an STI screening visit. The patient reports they do have symptoms.    Patient has the following medical conditions:  There are no problems to display for this patient.    Chief Complaint  Patient presents with   [place] man presents for eval of 1 week h/o urethral discharge. Wants STI eval.   Last HIV test per patient/review of record was No results found for: "HMHIVSCREEN" No results found for: "HIV"  Does the patient or their partner desires a pregnancy in the next year? No  Screening for MPX risk: Does the patient have an unexplained rash? No Is the patient MSM? No Does the patient endorse multiple sex partners or anonymous sex partners? No Did the patient have close or sexual contact with a person diagnosed with MPX? No Has the patient traveled outside the [place] where MPX is endemic? No Is there a high clinical suspicion for MPX-- evidenced by one of the following No  -Unlikely to be chickenpox  -Lymphadenopathy  -Rash that present in same phase of evolution on any given body part   See flowsheet for further details and programmatic requirements.    There is no immunization history on file for this patient.   The following portions of the patient's history were reviewed and updated as appropriate: allergies, current medications, past medical history, past social history, past surgical history and problem list.  Objective:  There were no vitals filed for this visit.  Physical Exam Constitutional:      Appearance: He is obese.  Pulmonary:     Effort: Pulmonary effort is normal.  Abdominal:     Palpations: Abdomen is soft.     Tenderness: There is no abdominal tenderness.  Genitourinary:    Pubic Area: No rash.      Penis: Circumcised. Discharge present. No erythema.      Testes: Normal.     Epididymis:      Right: No tenderness.     Left: No tenderness.     Tanner stage (genital): 5.     Comments: Scant clear urethral discharge Musculoskeletal:        General: No tenderness or deformity.  Lymphadenopathy:     Cervical: No cervical adenopathy.     Lower Body: No right inguinal adenopathy. No left inguinal adenopathy.  Skin:    General: Skin is warm and dry.     Findings: Rash present.     Comments: Dry excoriated rash on dorsal aspect of R foot and to lesser extent on dorsal aspect of hands. Pt states c/w his usual eczema pattern.  Neurological:     General: No focal deficit present.     Mental Status: He is alert.  Psychiatric:        Behavior: Behavior normal.      Assessment and Plan:  Nathan Compton is a 31 y.o. male presenting to the [facility] Department for STI screening  1. Routine screening for STI (sexually transmitted infection) Gram stain pos WBCs, treat NGU per S.O. - Gonococcus culture - HBV Antigen/Antibody State Lab - HIV/HCV [facility] - Syphilis Serology, [place] [facility] - Gram stain     Return in about 6 months (around 11/10/2022) for STI screening.  No future appointments.  Lora Havens, PA-C

## 2022-05-16 LAB — GONOCOCCUS CULTURE

## 2022-06-08 ENCOUNTER — Encounter: Payer: Self-pay | Admitting: Physician Assistant

## 2022-06-08 ENCOUNTER — Ambulatory Visit: Payer: Medicaid Other | Admitting: Physician Assistant

## 2022-06-08 DIAGNOSIS — Z113 Encounter for screening for infections with a predominantly sexual mode of transmission: Secondary | ICD-10-CM | POA: Diagnosis not present

## 2022-06-08 LAB — GRAM STAIN

## 2022-06-08 NOTE — Progress Notes (Signed)
[facility] Department STI clinic/screening visit  Subjective:  Nathan Compton is a 31 y.o. male being seen today for an STI screening visit. The patient reports they do have symptoms.    Patient has the following medical conditions:  There are no problems to display for this patient.    Chief Complaint  Patient presents with   SEXUALLY TRANSMITTED DISEASE    Man here for eval of 9 day h/o scant white urethral discharge. Of note, was treated for NGU 05/11/22, and completed all but 2-4 of his doxycyline tabs. No sex since August, no new exposures or other new meds.    Last HIV test per patient/review of record was  Lab Results  Component Value Date   HMHIVSCREEN Negative - Validated 05/11/2022   No results found for: "HIV"  Does the patient or their partner desires a pregnancy in the next year? No  Screening for MPX risk: Does the patient have an unexplained rash? No Is the patient MSM? No Does the patient endorse multiple sex partners or anonymous sex partners? No Did the patient have close or sexual contact with a person diagnosed with MPX? No Has the patient traveled outside the [place] where MPX is endemic? No Is there a high clinical suspicion for MPX-- evidenced by one of the following No  -Unlikely to be chickenpox  -Lymphadenopathy  -Rash that present in same phase of evolution on any given body part   See flowsheet for further details and programmatic requirements.   Immunization History  Administered Date(s) Administered   Hepatitis B, PED/ADOLESCENT 04/14/93, 06/07/1993, 01/04/1994   MMR 11/28/1996, 09/02/1997   Meningococcal Mcv4o 05/06/2014   Tdap 03/18/2007     The following portions of the patient's history were reviewed and updated as appropriate: allergies, current medications, past medical history, past social history, past surgical history and problem list.  Objective:  There were no vitals filed for this visit.  Physical  Exam Constitutional:      Appearance: He is obese.  Pulmonary:     Effort: Pulmonary effort is normal.  Abdominal:     Palpations: Abdomen is soft.  Genitourinary:    Penis: Circumcised. No tenderness, discharge, swelling or lesions.      Testes: Normal.     Epididymis:     Right: No mass or tenderness.     Left: No mass or tenderness.  Musculoskeletal:        General: No swelling.  Lymphadenopathy:     Lower Body: No right inguinal adenopathy. No left inguinal adenopathy.  Skin:    General: Skin is warm and dry.     Findings: No erythema or lesion.  Neurological:     Mental Status: He is alert and oriented to person, place, and time.  Psychiatric:        Behavior: Behavior normal.        Thought Content: Thought content normal.        Judgment: Judgment normal.       Assessment and Plan:  Nathan Compton is a 31 y.o. male presenting to the [facility] Department for STI screening  1. Routine screening for STI (sexually transmitted infection) Gram stain no diplococci, WBC < 2/hpf. No indication for treatment at present. Recommend watchful waiting, await GC culture result. Enc po liquids and return if sx worsen/persist. - Gram stain - Gonococcus culture   Patient does have STI symptoms Patient accepted all screenings including   Patient meets criteria for HepB screening?  No. Ordered? no Patient meets criteria for HepC screening? No. Ordered? No Hep B and Hep C testing neg within last month. Recommended condom use with all sex Discussed importance of condom use for STI prevent  Treat gram stain per standing order Discussed time line for State Lab results and that patient will be called with positive results and encouraged patient to call if he had not heard in 2 weeks Recommended returning for continued or worsening symptoms.   Return in about 3 months (around 09/08/2022) for STI screening.  No future appointments.  Lora Havens, PA-C

## 2022-06-08 NOTE — Progress Notes (Signed)
Gram stain with <2 hpf and gram negative diplococci absent. ARobbie Lis PA-C reviewed results. Jossie Ng, RN

## 2022-06-14 LAB — GONOCOCCUS CULTURE

## 2022-11-28 ENCOUNTER — Emergency Department
Admission: EM | Admit: 2022-11-28 | Discharge: 2022-11-28 | Disposition: A | Discharge status: Home / Self Care | Payer: Medicaid Other | Attending: Emergency Medicine | Admitting: Emergency Medicine

## 2022-11-28 ENCOUNTER — Other Ambulatory Visit: Payer: Self-pay

## 2022-11-28 DIAGNOSIS — H60501 Unspecified acute noninfective otitis externa, right ear: Secondary | ICD-10-CM | POA: Diagnosis not present

## 2022-11-28 DIAGNOSIS — H9201 Otalgia, right ear: Secondary | ICD-10-CM | POA: Diagnosis present

## 2022-11-28 MED ORDER — CIPRO HC 0.2-1 % OT SUSP: 4.0000 [drp] | Freq: Two times a day (BID) | OTIC | 0 refills | Status: AC

## 2022-11-28 NOTE — ED Triage Notes (Signed)
Pt comes with c/o right ear pain. Pt states this started today when he woke up. Pt denies any cold symptoms.

## 2022-11-28 NOTE — ED Provider Notes (Signed)
   [facility] Provider Note    Event Date/Time   First MD Initiated Contact with Patient 11/28/22 1319     (approximate)   History   Otalgia   HPI  Nathan Compton Notaro is a 32 y.o. male who presents with complaints of right ear pain.  He reports that this developed this morning.  No injury, no recent swimming.  No trauma.  No fevers or chills.     Physical Exam   Triage Vital Signs: ED Triage Vitals [11/28/22 1249]  Enc Vitals Group     BP (!) 179/118     Pulse Rate 72     Resp 18     Temp 98 F (36.7 C)     Temp src      SpO2 100 %     Weight      Height      Head Circumference      Peak Flow      Pain Score 8     Pain Loc      Pain Edu?      Excl. in GC?     Most recent vital signs: Vitals:   11/28/22 1249  BP: (!) 179/118  Pulse: 72  Resp: 18  Temp: 98 F (36.7 C)  SpO2: 100%     General: Awake, no distress.  CV:  Good peripheral perfusion.  Resp:  Normal effort.  Abd:  No distention.  Other:  TMs: Evidence of mild external acoustic inflammation on the right, otherwise reassuring exam   ED Results / Procedures / Treatments   Labs (all labs ordered are listed, but only abnormal results are displayed) Labs Reviewed - No data to display   EKG     RADIOLOGY     PROCEDURES:  Critical Care performed:   Procedures   MEDICATIONS ORDERED IN ED: Medications - No data to display   IMPRESSION / MDM / ASSESSMENT AND PLAN / ED COURSE  I reviewed the triage vital signs and the nursing notes. Patient's presentation is most consistent with acute, uncomplicated illness.  Exam most consistent with otitis externa, will start the patient on Ciprodex drops, outpatient follow-up as needed.        FINAL CLINICAL IMPRESSION(S) / ED DIAGNOSES   Final diagnoses:  Acute otitis externa of right ear, unspecified type     Rx / DC Orders   ED Discharge Orders          Ordered    ciprofloxacin-hydrocortisone (CIPRO  HC) OTIC suspension  2 times daily        11/28/22 1341             Note:  This document was prepared using Dragon voice recognition software and may include unintentional dictation errors.   Jene Every, MD 11/28/22 1451

## 2023-03-07 ENCOUNTER — Emergency Department
Admission: EM | Admit: 2023-03-07 | Discharge: 2023-03-07 | Disposition: A | Discharge status: Home / Self Care | Payer: Medicaid Other | Attending: Emergency Medicine | Admitting: Emergency Medicine

## 2023-03-07 ENCOUNTER — Encounter: Payer: Self-pay | Admitting: Emergency Medicine

## 2023-03-07 ENCOUNTER — Other Ambulatory Visit: Payer: Self-pay

## 2023-03-07 DIAGNOSIS — L0231 Cutaneous abscess of buttock: Secondary | ICD-10-CM | POA: Insufficient documentation

## 2023-03-07 MED ORDER — HYDROCODONE-ACETAMINOPHEN 5-325 MG PO TABS: 1.0000 | ORAL_TABLET | Freq: Four times a day (QID) | ORAL | 0 refills | Status: DC | PRN

## 2023-03-07 MED ORDER — LIDOCAINE HCL (PF) 1 % IJ SOLN
5.0000 mL | Freq: Once | INTRAMUSCULAR | Status: AC
  Administered 2023-03-07: 5 mL
  Filled 2023-03-07: qty 5

## 2023-03-07 MED ORDER — ACETAMINOPHEN 500 MG PO TABS
1000.0000 mg | ORAL_TABLET | Freq: Once | ORAL | Status: AC
  Administered 2023-03-07: 1000 mg via ORAL
  Filled 2023-03-07: qty 2

## 2023-03-07 MED ORDER — CEPHALEXIN 500 MG PO CAPS: 500.0000 mg | ORAL_CAPSULE | Freq: Four times a day (QID) | ORAL | 0 refills | Status: AC

## 2023-03-07 MED ORDER — LIDOCAINE-EPINEPHRINE-TETRACAINE (LET) TOPICAL GEL
3.0000 mL | Freq: Once | TOPICAL | Status: AC
  Administered 2023-03-07: 3 mL via TOPICAL
  Filled 2023-03-07: qty 3

## 2023-03-07 MED ORDER — CEPHALEXIN 500 MG PO CAPS: 500.0000 mg | ORAL_CAPSULE | Freq: Four times a day (QID) | ORAL | 0 refills | Status: DC

## 2023-03-07 MED ORDER — HYDROCODONE-ACETAMINOPHEN 5-325 MG PO TABS: 1.0000 | ORAL_TABLET | Freq: Four times a day (QID) | ORAL | 0 refills | Status: AC | PRN

## 2023-03-07 NOTE — ED Triage Notes (Signed)
Pt via POV from home. Pt c/o abscess on his bottom. States he noticed 3 days ago and now it is way bigger. States that he did noticed some blood and pus draining starting today. Pt is A&Ox4 and NAD

## 2023-03-07 NOTE — ED Provider Notes (Signed)
[facility] Emergency Department Provider Note     Event Date/Time   First MD Initiated Contact with Patient 03/07/23 1607     (approximate)   History   Abscess   HPI  Nathan Compton is a 32 y.o. male presents to the ED with complaint of an abscess on inner buttock for 3 to 4 days.  Patient has noticed increasing in size and pain. Patient reports he noticed a small amount of pus and blood drainage today.   Patient has tried Tylenol and ibuprofen for pain with no relief he is also tried using " fat back" is a home remedy to reduce swelling.  patient reports swelling has went down but has since returned.  Pain score 9/10.  Denies fever and chills.     Physical Exam   Triage Vital Signs: ED Triage Vitals  Encounter Vitals Group     BP 03/07/23 1508 (!) 155/108     Systolic BP Percentile --      Diastolic BP Percentile --      Pulse Rate 03/07/23 1508 89     Resp 03/07/23 1508 18     Temp 03/07/23 1508 98.7 F (37.1 C)     Temp Source 03/07/23 1508 Oral     SpO2 03/07/23 1508 99 %     Weight 03/07/23 1511 236 lb (107 kg)     Height 03/07/23 1511 5\' 10"  (1.778 m)     Head Circumference --      Peak Flow --      Pain Score 03/07/23 1510 6     Pain Loc --      Pain Education --      Exclude from Growth Chart --     Most recent vital signs: Vitals:   03/07/23 1508 03/07/23 1918  BP: (!) 155/108 (!) 155/95  Pulse: 89 80  Resp: 18 18  Temp: 98.7 F (37.1 C) 98.5 F (36.9 C)  SpO2: 99% 100%    General Awake, no distress.  HEENT NCAT. PERRL. EOMI. CV:  Good peripheral perfusion.  RESP:  Normal effort.  ABD:  No distention.  Other:  7x5 cm right perirectal abscess with surrounding induration.     ED Results / Procedures / Treatments   Labs (all labs ordered are listed, but only abnormal results are displayed) Labs Reviewed - No data to display  No results found.  PROCEDURES:  Critical Care performed: No  Ultrasound ED Soft  Tissue  Date/Time: 03/08/2023 8:56 AM  Performed by: Conrad [place], PA-C Authorized by: Kern Reap A, PA-C   Procedure details:    Indications: localization of abscess and evaluate for cellulitis   Location:    Location comment:  Perianal   Side:  Right Findings:     abscess present    cellulitis present .Marland KitchenIncision and Drainage  Date/Time: 03/08/2023 8:57 AM  Performed by: Conrad El Dara, PA-C Authorized by: Conrad , PA-C   Consent:    Consent obtained:  Verbal   Consent given by:  Patient   Risks discussed:  Bleeding, incomplete drainage, pain and damage to other organs   Alternatives discussed:  No treatment Universal protocol:    Procedure explained and questions answered to patient or proxy's satisfaction: yes     Patient identity confirmed:  Verbally with patient Location:    Type:  Abscess   Size:  7x5   Location:  Anogenital   Anogenital location:  Perianal Pre-procedure details:  Skin preparation:  Betadine Anesthesia:    Anesthesia method:  Local infiltration   Local anesthetic:  Lidocaine 1% WITH epi Procedure type:    Complexity:  Complex Procedure details:    Ultrasound guidance: yes     Incision types:  Stab incision   Incision depth:  Subcutaneous   Wound management:  Probed and deloculated, irrigated with saline and extensive cleaning   Drainage:  Purulent and bloody   Drainage amount:  Moderate   Wound treatment:  Wound left open   Packing materials:  1/4 in gauze and 1/4 in iodoform gauze Post-procedure details:    Procedure completion:  Tolerated well, no immediate complications  MEDICATIONS ORDERED IN ED: Medications  lidocaine (PF) (XYLOCAINE) 1 % injection 5 mL (5 mLs Infiltration Given by Other 03/07/23 1805)  lidocaine-EPINEPHrine-tetracaine (LET) topical gel (3 mLs Topical Given by Other 03/07/23 1806)  acetaminophen (TYLENOL) tablet 1,000 mg (1,000 mg Oral Given 03/07/23 1911)    IMPRESSION / MDM / ASSESSMENT AND PLAN  / ED COURSE  I reviewed the triage vital signs and the nursing notes.                              Clinical Course as of 03/08/23 0907  Wed Mar 07, 2023  1717 3-4 days. Small amount of pus and blood this morning. Cleaning continually.  [MH]    Clinical Course User Index [MH] Kern Reap A, PA-C    32 y.o. male presents to the emergency department for evaluation and treatment of right perianal abscess. See HPI for further details.   Differential diagnosis includes, but is not limited to abscess, cyst, cellulitis, fistula, hemorrhoid.  Patient's presentation is most consistent with acute complicated illness / injury requiring diagnostic workup.  Ultrasound performed by this provider.  Revealing a approximately 7 x 5 right perianal abscess.  An I&D is performed with moderate dark red and purulent pus drainage.  Patient tolerated procedure without complications.  Abscess is packed and it is recommended that patient returns in 2 days for packing removal and wound check.  A gauze is placed over incision area.  Patient is instructed to extensively clean the site gently with soap and water. Sitz bath encouraged. Antibiotics are prescribed.  Patient does not have a primary care.  He is recommended to return to ED for packing change.  A list of primary care offices excepting new patients is provided to him.  ED precautions are discussed.  Normal Patient verbalized understanding all questions and concerns were addressed during this ED visit.   FINAL CLINICAL IMPRESSION(S) / ED DIAGNOSES   Final diagnoses:  Abscess of buttock, right    Rx / DC Orders   ED Discharge Orders          Ordered    HYDROcodone-acetaminophen (NORCO/VICODIN) 5-325 MG tablet  Every 6 hours PRN,   Status:  Discontinued        03/07/23 1900    cephALEXin (KEFLEX) 500 MG capsule  4 times daily,   Status:  Discontinued        03/07/23 1900    cephALEXin (KEFLEX) 500 MG capsule  4 times daily        03/07/23 2027     HYDROcodone-acetaminophen (NORCO/VICODIN) 5-325 MG tablet  Every 6 hours PRN        03/07/23 2027             Note:  This document was prepared using  Dragon Chemical engineer and may include unintentional dictation errors.    Romeo Apple, Tyshana Nishida A, PA-C 03/08/23 1610    Janith Lima, MD 03/08/23 [phone]

## 2023-03-07 NOTE — Discharge Instructions (Addendum)
Return in 2 days for packing change and wound check.  Clean the area daily with soap and water.  I encouraged sitz bath's 3 times a day.  Closely monitor size of abscess.  Please schedule an appointment with general surgery.

## 2023-09-30 IMAGING — CR DG ANKLE COMPLETE 3+V*R*
1 series · 3 of 3 positions shown · non-contrast
Comparison: None.

CLINICAL DATA: Trauma to right ankle, crush injury, pain and
swelling

EXAM:
RIGHT ANKLE - COMPLETE 3+ VIEW

[Series 1: x ankle ap right · 0.14mm/px · 3 of 3 slices shown]
[im 1/3]
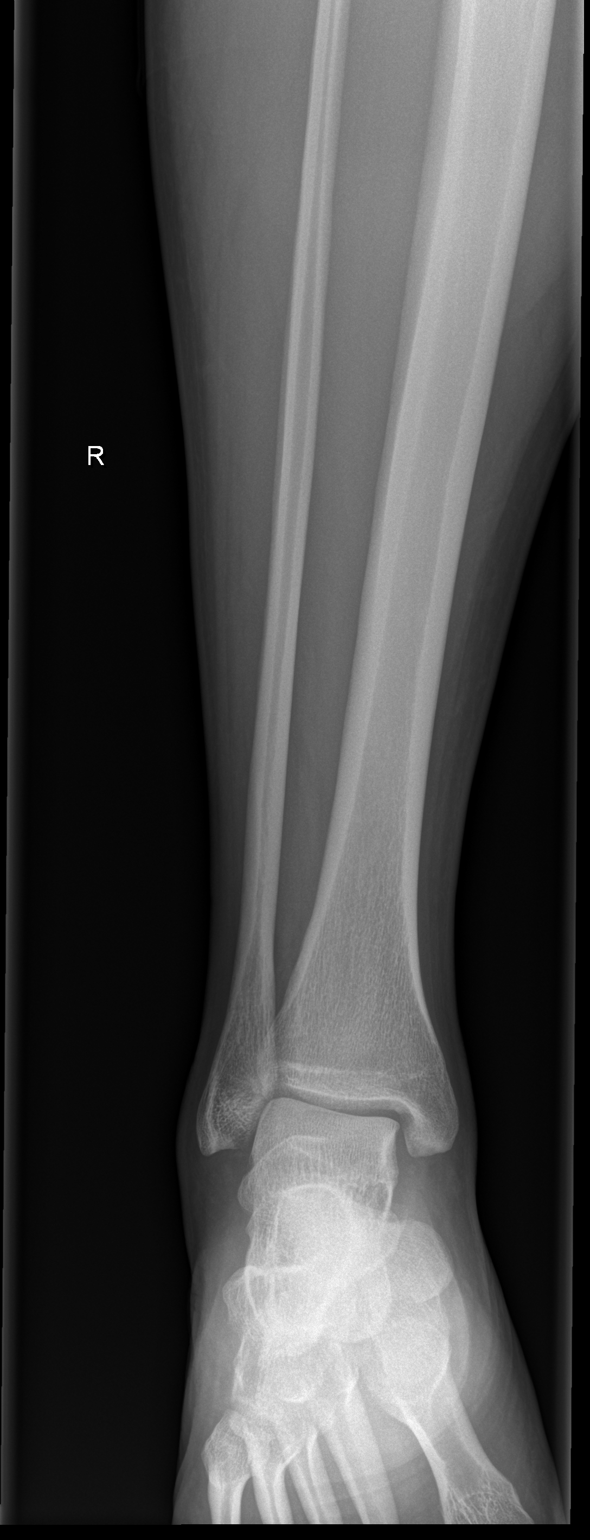
[im 2/3]
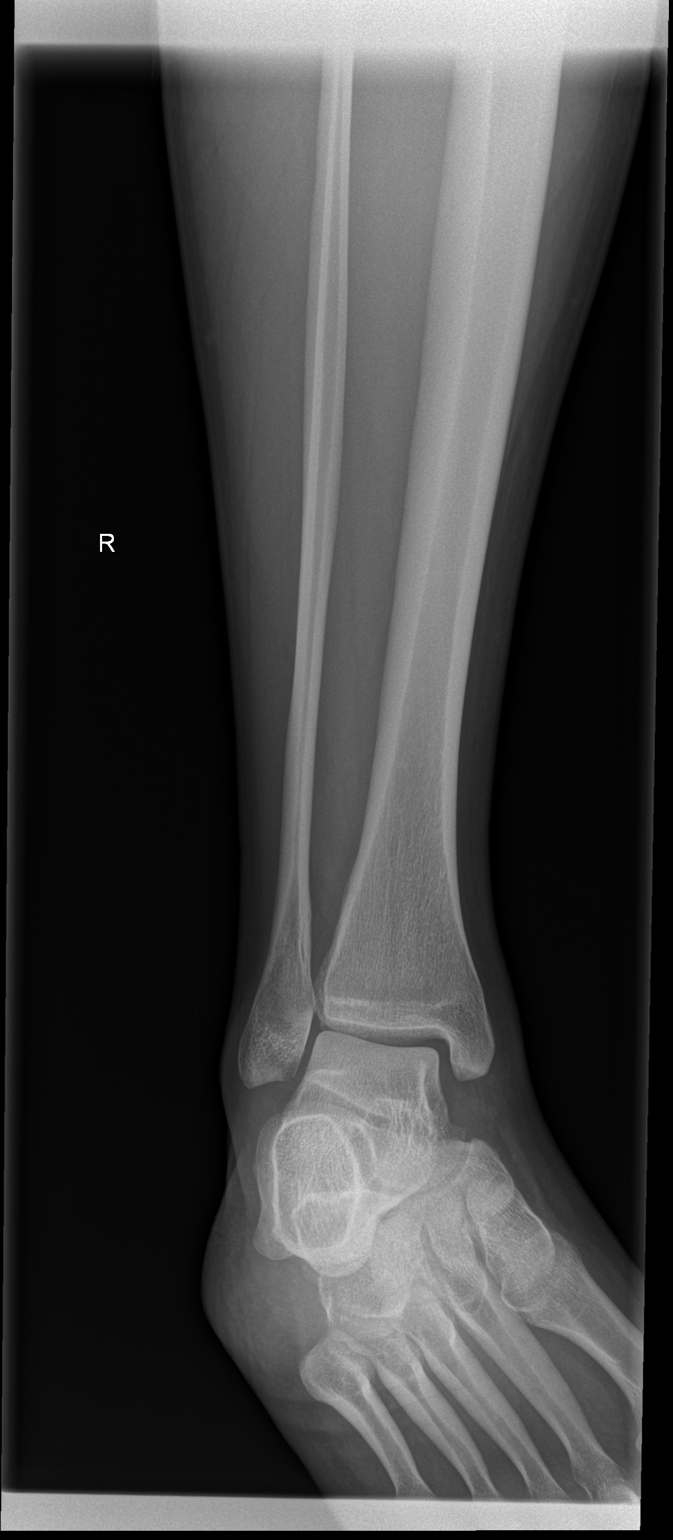
[im 3/3]
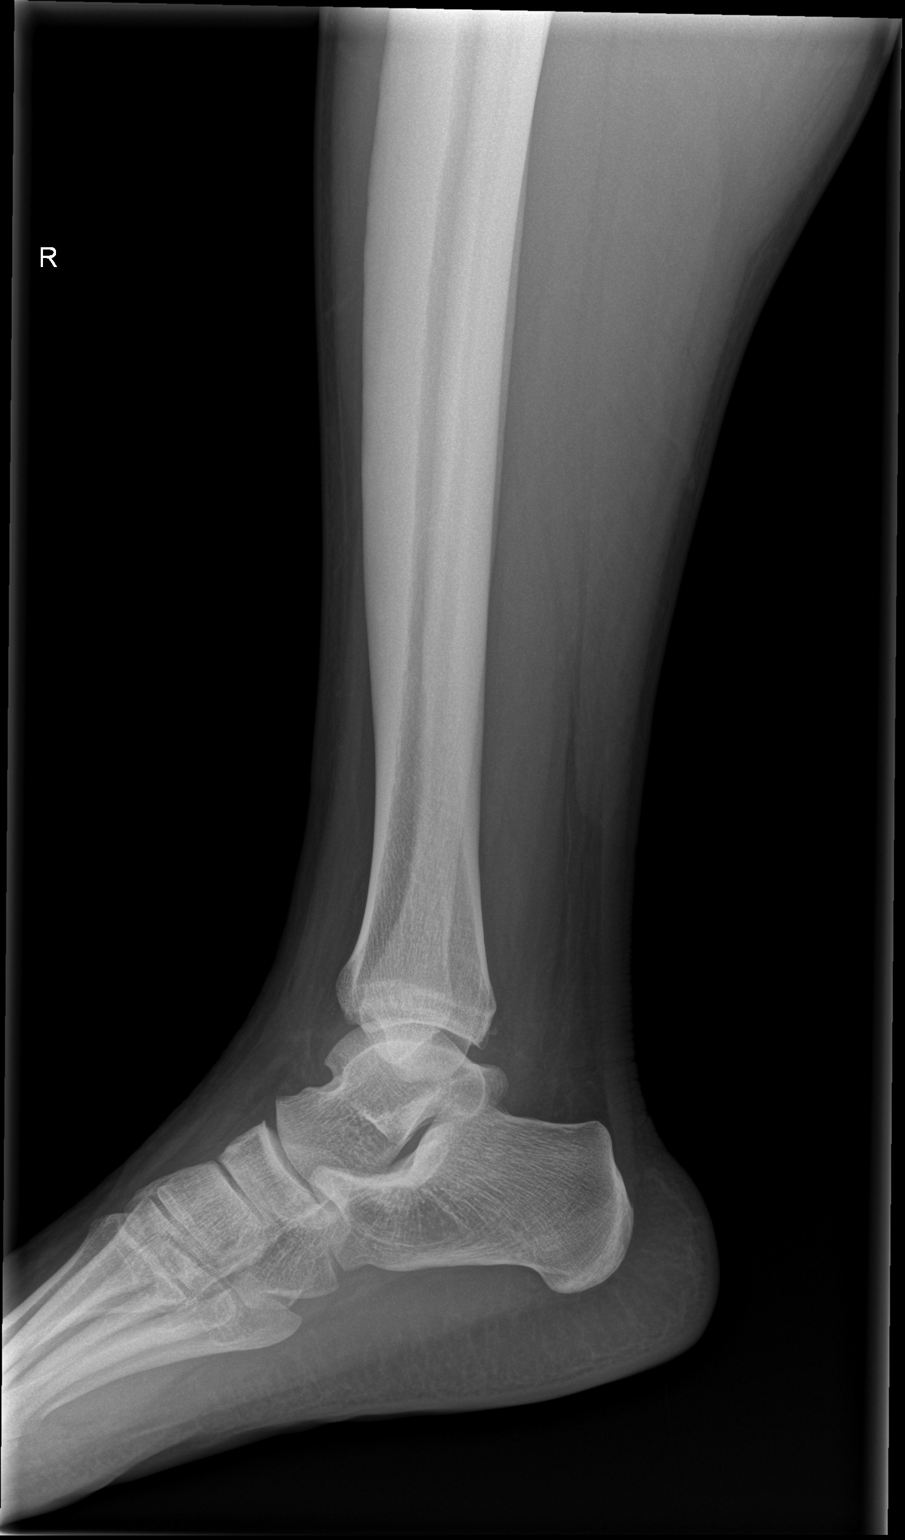

[3 of 3 positions shown; findings below may reference images not displayed]

FINDINGS: Frontal, oblique, lateral views of the right ankle are obtained. No
acute fracture. Slight widening of the ankle mortise along the
medial aspect, which could reflect underlying ligamentous laxity or
injury. No dislocation. Joint spaces are well preserved. Soft
tissues are unremarkable.
IMPRESSION: 1. No acute bony abnormality.
2. Mild widening of the ankle mortise along the medial aspect. This
could be due to ligamentous laxity or injury.

## 2023-10-21 ENCOUNTER — Emergency Department

## 2023-10-21 ENCOUNTER — Other Ambulatory Visit: Payer: Self-pay

## 2023-10-21 DIAGNOSIS — A749 Chlamydial infection, unspecified: Secondary | ICD-10-CM | POA: Insufficient documentation

## 2023-10-21 DIAGNOSIS — N451 Epididymitis: Secondary | ICD-10-CM | POA: Insufficient documentation

## 2023-10-21 DIAGNOSIS — N50811 Right testicular pain: Secondary | ICD-10-CM | POA: Diagnosis present

## 2023-10-21 NOTE — ED Triage Notes (Signed)
 Pt from home via POV.  Pt states "my right testicle is the size of an egg."  Pt states pain is 3/10 or 5/10 upon palpation or exertion.  Pt states Sx started yesterday evening but noted more substantial swelling after work today.  Pt denies recent trauma.  Pt denies dysuria.

## 2023-10-22 ENCOUNTER — Emergency Department
Admission: EM | Admit: 2023-10-22 | Discharge: 2023-10-22 | Disposition: A | Discharge status: Home / Self Care | Attending: Emergency Medicine | Admitting: Emergency Medicine

## 2023-10-22 DIAGNOSIS — N451 Epididymitis: Secondary | ICD-10-CM

## 2023-10-22 DIAGNOSIS — N5089 Other specified disorders of the male genital organs: Secondary | ICD-10-CM

## 2023-10-22 DIAGNOSIS — A749 Chlamydial infection, unspecified: Secondary | ICD-10-CM

## 2023-10-22 LAB — URINALYSIS, ROUTINE W REFLEX MICROSCOPIC
Bilirubin Urine: NEGATIVE
Glucose, UA: NEGATIVE mg/dL
Ketones, ur: NEGATIVE mg/dL
Nitrite: NEGATIVE
Protein, ur: NEGATIVE mg/dL
Specific Gravity, Urine: 1.026 (ref 1.005–1.030)
WBC, UA: >50 WBC/hpf (ref 0–5)
pH: 5 (ref 5.0–8.0)

## 2023-10-22 LAB — CHLAMYDIA/NGC RT PCR (ARMC ONLY)
Chlamydia Tr: DETECTED — AB
N gonorrhoeae: NOT DETECTED

## 2023-10-22 MED ORDER — ONDANSETRON 4 MG PO TBDP
4.0000 mg | ORAL_TABLET | Freq: Once | ORAL | Status: AC
  Administered 2023-10-22: 4 mg via ORAL
  Filled 2023-10-22: qty 1

## 2023-10-22 MED ORDER — CIPROFLOXACIN HCL 500 MG PO TABS: 500.0000 mg | ORAL_TABLET | Freq: Two times a day (BID) | ORAL | 0 refills | Status: AC

## 2023-10-22 MED ORDER — KETOROLAC TROMETHAMINE 60 MG/2ML IM SOLN
30.0000 mg | Freq: Once | INTRAMUSCULAR | Status: AC
  Administered 2023-10-22: 30 mg via INTRAMUSCULAR
  Filled 2023-10-22: qty 2

## 2023-10-22 MED ORDER — AZITHROMYCIN 1 G PO PACK
1.0000 g | PACK | Freq: Once | ORAL | Status: AC
  Administered 2023-10-22: 1 g via ORAL
  Filled 2023-10-22: qty 1

## 2023-10-22 MED ORDER — CIPROFLOXACIN HCL 500 MG PO TABS
500.0000 mg | ORAL_TABLET | Freq: Once | ORAL | Status: AC
  Administered 2023-10-22: 500 mg via ORAL
  Filled 2023-10-22: qty 1

## 2023-10-22 NOTE — ED Provider Notes (Signed)
 [facility] Provider Note    Event Date/Time   First MD Initiated Contact with Patient 10/22/23 0015     (approximate)   History   Groin Swelling   HPI  Nathan Compton is a 33 y.o. male who presents to the ED from home with a chief complaint of nontraumatic right testicle pain and swelling.  Patient reports intermittent pain and swelling over the past week, worse times yesterday.  Denies trauma.  Denies associated fever/chills, chest pain, shortness of breath, abdominal pain, nausea, vomiting, urethral discharge, dysuria or hematuria.  Denies recent travel.  Lifts heavy things at work.     Past Medical History   Past Medical History:  Diagnosis Date   Eczema      Active Problem List  There are no active problems to display for this patient.    Past Surgical History  History reviewed. No pertinent surgical history.   Home Medications   Prior to Admission medications   Not on File     Allergies  Patient has no known allergies.   Family History  History reviewed. No pertinent family history.   Physical Exam  Triage Vital Signs: ED Triage Vitals  Encounter Vitals Group     BP 10/21/23 2238 (!) 156/95     Systolic BP Percentile --      Diastolic BP Percentile --      Pulse Rate 10/21/23 2238 86     Resp 10/21/23 2238 16     Temp 10/21/23 2238 98.8 F (37.1 C)     Temp Source 10/21/23 2238 Oral     SpO2 10/21/23 2238 99 %     Weight 10/21/23 2240 236 lb (107 kg)     Height 10/21/23 2240 5\' 10"  (1.778 m)     Head Circumference --      Peak Flow --      Pain Score 10/21/23 2240 3     Pain Loc --      Pain Education --      Exclude from Growth Chart --     Updated Vital Signs: BP (!) 147/99 (BP Location: Left Arm)   Pulse 64   Temp 97.9 F (36.6 C) (Oral)   Resp 18   Ht 5\' 10"  (1.778 m)   Wt 107 kg   SpO2 97%   BMI 33.86 kg/m    General: Awake, no distress.  CV:  RRR.  Good peripheral perfusion.  Resp:  Normal  effort.  CTAB. Abd:  Nontender to light or deep palpation.  No distention.  Other:  GU: Circumcised male.  No urethral discharge.  Right testicle moderately swollen, nontender, no horizontal lie.  Strong bilateral cremasteric reflexes.   ED Results / Procedures / Treatments  Labs (all labs ordered are listed, but only abnormal results are displayed) Labs Reviewed  CHLAMYDIA/NGC RT PCR ([facility] ONLY)           - Abnormal; Notable for the following components:      Result Value   Chlamydia Tr DETECTED (*)    All other components within normal limits  URINALYSIS, ROUTINE W REFLEX MICROSCOPIC - Abnormal; Notable for the following components:   Color, Urine YELLOW (*)    APPearance HAZY (*)    Hgb urine dipstick SMALL (*)    Leukocytes,Ua SMALL (*)    Bacteria, UA RARE (*)    All other components within normal limits     EKG  None   RADIOLOGY I have  independently visualized and interpreted patient's imaging study as well as noted the radiology interpretation:  Ultrasound: No torsion, epididymitis  Official radiology report(s): US SCROTUM W/DOPPLER Result Date: 10/22/2023 CLINICAL DATA:  Swelling, pain on right side EXAM: SCROTAL ULTRASOUND DOPPLER ULTRASOUND OF THE TESTICLES TECHNIQUE: Complete ultrasound examination of the testicles, epididymis, and other scrotal structures was performed. Color and spectral Doppler ultrasound were also utilized to evaluate blood flow to the testicles. COMPARISON:  None Available. FINDINGS: Right testicle Measurements: 4.5 x 2.4 x 3.1 cm. No mass or microlithiasis visualized. Left testicle Measurements: 4.3 x 2.5 x 2.9 cm. No mass or microlithiasis visualized. Right epididymis: Enlarged, heterogeneous and hypervascular compatible with epididymitis. Left epididymis:  Normal in size and appearance. Hydrocele:  None visualized. Varicocele:  None visualized. Pulsed Doppler interrogation of both testes demonstrates normal low resistance arterial and venous  waveforms bilaterally. IMPRESSION: No testicular abnormality or evidence of torsion. Enlarged, heterogeneous and hypervascular right epididymis compatible with epididymitis. Electronically Signed   By: Charlett Nose M.D.   On: 10/22/2023 00:24     PROCEDURES:  Critical Care performed: No  Procedures   MEDICATIONS ORDERED IN ED: Medications  azithromycin (ZITHROMAX) powder 1 g (has no administration in time range)  ondansetron (ZOFRAN-ODT) disintegrating tablet 4 mg (has no administration in time range)  ketorolac (TORADOL) injection 30 mg (30 mg Intramuscular Given 10/22/23 0057)  ciprofloxacin (CIPRO) tablet 500 mg (500 mg Oral Given 10/22/23 0058)     IMPRESSION / MDM / ASSESSMENT AND PLAN / ED COURSE  I reviewed the triage vital signs and the nursing notes.                             33 year old male presenting with right testicle pain and swelling. Differential diagnosis includes, but is not limited to, acute appendicitis, renal colic, testicular torsion, urinary tract infection/pyelonephritis, prostatitis,  epididymitis, diverticulitis, small bowel obstruction or ileus, colitis, abdominal aortic aneurysm, gastroenteritis, hernia, etc. I personally reviewed patient's records and note STI screen 06/08/2022.  Patient's presentation is most consistent with acute complicated illness / injury requiring diagnostic workup.  Ultrasound demonstrates epididymitis.  Patient provided urine sample.  Will reassess.  Clinical Course as of 10/22/23 0237  Mon Oct 22, 2023  0236 Patient chlamydia+; small LE in urine. Will administer Azithromycin packet now, discharge on Cipro.  Tricked return precautions given.  Patient verbalizes understanding and agrees with plan of care. [JS]    Clinical Course User Index [JS] Irean Hong, MD     FINAL CLINICAL IMPRESSION(S) / ED DIAGNOSES   Final diagnoses:  Swelling of right testicle  Epididymitis  Chlamydia     Rx / DC Orders   ED Discharge  Orders     None        Note:  This document was prepared using Dragon voice recognition software and may include unintentional dictation errors.   Irean Hong, MD 10/22/23 [phone]

## 2023-10-22 NOTE — Discharge Instructions (Signed)
 Take and finish antibiotic as prescribed.  Please inform your sexual partner to seek treatment at the health department.  Return to the ER for worsening symptoms, persistent vomiting, fever or other concerns.

## 2024-04-01 ENCOUNTER — Ambulatory Visit (LOCAL_COMMUNITY_HEALTH_CENTER)

## 2024-04-01 DIAGNOSIS — Z111 Encounter for screening for respiratory tuberculosis: Secondary | ICD-10-CM

## 2024-04-01 NOTE — Progress Notes (Signed)
 Tuberculin skin test applied to left ventral forearm.  Explained not to use lotions or creams, place bandages or wraps on testing area.  Advised, if bleeding occurs, to lightly dab testing area and given 2x2 gauze in case of leakage or bleeding.  Kandi KATHEE Glatter, RN

## 2024-04-03 ENCOUNTER — Ambulatory Visit

## 2024-04-03 DIAGNOSIS — Z111 Encounter for screening for respiratory tuberculosis: Secondary | ICD-10-CM

## 2024-04-03 LAB — TB SKIN TEST
Induration: 0 mm
TB Skin Test: NEGATIVE

## 2024-04-03 NOTE — Progress Notes (Signed)
 Pt presents for PPDR PPDR results entered in Epic Result: 0 mm induration. Interpretation: Negative

## 2024-07-25 ENCOUNTER — Ambulatory Visit

## 2024-07-25 DIAGNOSIS — R369 Urethral discharge, unspecified: Secondary | ICD-10-CM

## 2024-07-25 DIAGNOSIS — Z113 Encounter for screening for infections with a predominantly sexual mode of transmission: Secondary | ICD-10-CM | POA: Diagnosis not present

## 2024-07-25 LAB — GRAM STAIN

## 2024-07-25 LAB — HM HIV SCREENING LAB: HM HIV Screening: NEGATIVE

## 2024-07-25 NOTE — Progress Notes (Signed)
 Pt is here for STD screening, gram stain results reviewed with no treatment required per SO. Larraine JONELLE Novak, RN

## 2024-07-25 NOTE — Progress Notes (Signed)
 " [facility] STI clinic [address] [place] [address] Main phone: [phone]  STI screening visit  Subjective:  Nathan Compton is a 34 y.o. male being seen today for an STI screening visit. The patient reports they do have symptoms.    Patient has the following medical conditions:  There are no active problems to display for this patient.  Chief Complaint  Patient presents with   SEXUALLY TRANSMITTED DISEASE    Penile discharge     HPI Patient reports 2-3 days ago, noticed a white discharge from his penis. Slight tingling, no dysuria or irritation. 1 partner in last 2 months. Current smoker, wants to quit. Quit Line card and PCP list provided, has Medicaid and discussed PCP support/medication assistance for cessation.  Reproductive considerations: Does the patient or their partner desires a pregnancy in the next year? No  See flowsheet for further details and programmatic requirements  Hyperlink available at the top of the signed note in blue.  Flow sheet content below:  Pregnancy Intention Screening Does the patient want to become pregnant in the next year?: N/A Does the patient's partner want to become pregnant in the next year?: No Would the patient like to discuss contraceptive options today?: N/A Reason For STD Screen STD Screening: Has symptoms Have you ever had an STD?: Yes History of Antibiotic use in the past 2 weeks?: No STD Symptoms Denies all: No Genital Itching: No Lower abdominal pain: No Discharge: Yes Dysuria: No Genital ulcer / lesion: No Rash: No Vaginal irritation: No Oral / Other skin ulcer: No Pain with sex: No Sore Throat: No Visual Changes: No Vaginal Bleeding: No Risk Factors for Hep B Household, sexual, or needle sharing contact of a person infected with Hep B: No Sexual contact with a person who uses drugs not as prescribed?: No Currently or Ever used drugs not as prescribed: No HIV  Positive: No PRep Patient: No Men who have sex with men: No Have Hepatitis C: No History of Incarceration: No History of Homeslessness?: No Anal sex following anal drug use?: No Risk Factors for Hep C Currently using drugs not as prescribed: No Sexual partner(s) currently using drugs as not prescribed: No History of drug use: No HIV Positive: No People with a history of incarceration: No People born between the years of 61 and 10: No Advise Advised client to quit or stay quit. : Yes Counseling Patient counseled to use condoms with all sex: Condoms declined RTC in 2-3 weeks for test results: Yes Clinic will call if test results abnormal before test result appt.: Yes Immunizations: Immunization history assessed, Referred Test results given to patient Patient counseled to use condoms with all sex: Condoms declined  Screening for MPX risk:  Unexplained rash?  No   MSM?  No   Multiple or anonymous sex partners?  No   Any close or sexual contact with a person  diagnosed with MPX?  No   Any outside the [place]  where MPX is endemic?  No   High clinical suspicion for MPX?    -Unlikely to be chickenpox    -Lymphadenopathy    -Rash that presents in same phase of       evolution on any given body part  No   Does this patient meet CDC recommendations for vaccination against MPOX? No  You already have or anticipate having the following risks:  Your sex partner has the following risks: You're traveling to a county with a  clade I MPOX outbreak and anticipate these risks: Occupational exposure  You had known or suspected exposure to someone with monkeypox You had a sex partner in the past 2 weeks who was diagnosed with monkeypox You are a gay, bisexual, or other man who has sex with men, or are transgender or nonbinary and in the past 6 months have had any of the following: - A new diagnosis of one or more sexually transmitted diseases (e.g., chlamydia, gonorrhea, or syphilis) - More than  one sex partner You have had any of the following in the past 6 months: - Sex at a commercial sex venue (like a sex club or bathhouse) - Sex related to a large commercial event   or in a geographic area (city or county for example) where mpox virus transmission is occurring Sex with a new partner Sex at a commercial sex venue (e.g., a sex club or bathhouse) Sex in it consultant for money, goods, drugs, or other trade Sex in association with a large public event (e.g., a rave, party, or festival) i.e. certain people who work in a laboratory or healthcare facility   Infectious disease screenings: Vaccinated against HPV? Unknown  HIV Ever had a positive? No Last test: 2023 Results in chart:  Lab Results  Component Value Date   HMHIVSCREEN Negative - Validated 05/11/2022   No results found for: HIV   Hep B Hep B status: negative on 2023 Received HBV vaccination? Yes Received HBV testing for immunity? Yes Results in chart:  No components found for: HMHEPBSCREEN  Do they qualify for HBV screening today? No Criteria:  -Household, sexual or needle sharing contact with HBV -History of drug use or homelessness -HIV positive -Those with known Hep C  Hep C Hep C status: negative on 2023 Results in chart:  Lab Results  Component Value Date   HMHEPCSCREEN Negative-Validated 05/11/2022   No components found for: HEPC  Do they qualify for HCV screening today? No Criteria - since the last HCV result, does the patient have any of the following? - Current drug use - Have a partner with drug use - Has been incarcerated  Immunization history:  Immunization History  Administered Date(s) Administered   Hepatitis B, PED/ADOLESCENT 01/11/1993, 06/07/1993, 01/04/1994   MMR 11/28/1996, 09/02/1997   Meningococcal Mcv4o 05/06/2014   PPD Test 04/01/2024   Tdap 03/18/2007    The following portions of the patient's history were reviewed and updated as appropriate: allergies, current  medications, past medical history, past social history, past surgical history and problem list.  Substance use screenings:  Uses tobacco products? Yes Uses vapes? Yes Uses alcohol? 2-3 times per year Uses non-injectable substances that alter your mental status? No Uses non-prescribed injectable substances? No  Immunization History  Administered Date(s) Administered   Hepatitis B, PED/ADOLESCENT 1990-12-01, 06/07/1993, 01/04/1994   MMR 11/28/1996, 09/02/1997   Meningococcal Mcv4o 05/06/2014   PPD Test 04/01/2024   Tdap 03/18/2007    The following portions of the patient's history were reviewed and updated as appropriate: allergies, current medications, past medical history, past social history, past surgical history and problem list.  Objective:  There were no vitals filed for this visit.  Physical Exam Exam conducted with a chaperone present Nathan Compton).  Constitutional:      Appearance: Normal appearance.  HENT:     Head: Normocephalic and atraumatic.     Comments: No nits or hair loss on scalp, brows, or lashes    Mouth/Throat:     Mouth: Mucous membranes  are moist. No oral lesions.     Pharynx: Oropharynx is clear. No oropharyngeal exudate or posterior oropharyngeal erythema.  Eyes:     General:        Right eye: No discharge.        Left eye: No discharge.     Conjunctiva/sclera: Conjunctivae normal.     Right eye: Right conjunctiva is not injected. No exudate.    Left eye: Left conjunctiva is not injected. No exudate. Pulmonary:     Effort: Pulmonary effort is normal.  Genitourinary:    Pubic Area: No rash or pubic lice (no nits).      Penis: Normal. No discharge or lesions.      Testes: Normal.     Epididymis:     Right: Normal. No mass or tenderness.     Left: Normal. No mass or tenderness.     Rectum: Tenderness: no lesions or discharge.     Comments: Penile Discharge Amount: small Color:  clear Lymphadenopathy:     Cervical: No cervical adenopathy.      Upper Body:     Right upper body: No supraclavicular adenopathy.     Left upper body: No supraclavicular adenopathy.     Lower Body: No right inguinal adenopathy. No left inguinal adenopathy.  Skin:    General: Skin is warm and dry.     Findings: No lesion or rash.  Neurological:     Mental Status: He is alert and oriented to person, place, and time.    Assessment and Plan:  Nathan Compton is a 34 y.o. male presenting to the [facility] for STI screening.  Patient accepted the following screenings: penile gram stain for GC, urine CT/GC, HIV, and RPR  1. Screening for venereal disease (Primary)  - Chlamydia/GC NAA, Confirmation - HIV [facility] - Syphilis Serology, [facility] - Gram stain  2. Penile discharge  - Gram stain negative today  Counseling: Recommended condom use with all sex Discussed importance of condom use for STI prevention Discussed time line for [facility] results and that patient will be called with positive results and encouraged patient to call if they had not heard in 2 weeks Recommended repeat testing in 3 months with positive results. Recommended returning for continued or worsening symptoms.   Return if symptoms worsen or fail to improve.  No future appointments.  Damien FORBES Satchel, NP "

## 2024-07-28 ENCOUNTER — Ambulatory Visit: Payer: Self-pay

## 2024-07-28 LAB — C. TRACHOMATIS NAA, CONFIRM: C. trachomatis NAA, Confirm: POSITIVE — AB

## 2024-07-28 LAB — CHLAMYDIA/GC NAA, CONFIRMATION
Chlamydia trachomatis, NAA: POSITIVE — AB
Neisseria gonorrhoeae, NAA: NEGATIVE

## 2024-07-28 NOTE — Progress Notes (Signed)
 Pt returned call. Scheduled to come in for treatment on 07/29/24 @9am .

## 2024-07-28 NOTE — Progress Notes (Signed)
 Attempted to contact pt. No answer . LMTRC.Sent message on mychart.

## 2024-07-29 ENCOUNTER — Ambulatory Visit

## 2024-07-29 DIAGNOSIS — A749 Chlamydial infection, unspecified: Secondary | ICD-10-CM

## 2024-07-29 MED ORDER — DOXYCYCLINE HYCLATE 100 MG PO TABS: 100.0000 mg | ORAL_TABLET | Freq: Two times a day (BID) | ORAL | Status: AC

## 2024-07-29 NOTE — Progress Notes (Signed)
 In nurse clinic for chlamydia treatment. Reviewed test results with patient. Answered any questions. Per pt he has already notified his partner.No contact cards given.Treated per S.O. Dr. Macario, MD with Doxycycline  100mg  1 PO BID x7days. Advised pt not to have sex with or without a condom until completes treatment. Also not to have sex with partner until they complete treatment. Recommend to return to clinic in for TOC.Declines condoms today.  The patient was dispensed Doxycycline  100mg  #14 today. I provided counseling today regarding the medication. We discussed the medication, the side effects and when to call clinic. Patient given the opportunity to ask questions. Questions answered.

## 2024-08-04 NOTE — Progress Notes (Signed)
 Pt came in for tx.
